# Patient Record
Sex: Female | Born: 1980 | Race: White | Hispanic: No | Smoking: Current every day smoker
Health system: Southern US, Community
[De-identification: ages and names within clinical notes are randomized; demographics above are authoritative.]

## PROBLEM LIST (undated history)

## (undated) DIAGNOSIS — F1990 Other psychoactive substance use, unspecified, uncomplicated: Secondary | ICD-10-CM

## (undated) HISTORY — PX: APPENDECTOMY: SHX54

---

## 2019-03-24 ENCOUNTER — Emergency Department (HOSPITAL_COMMUNITY)
Admission: EM | Admit: 2019-03-24 | Discharge: 2019-03-24 | Disposition: A | Discharge status: Home / Self Care | Payer: Medicaid Other | Attending: Emergency Medicine | Admitting: Emergency Medicine

## 2019-03-24 ENCOUNTER — Other Ambulatory Visit: Payer: Self-pay

## 2019-03-24 ENCOUNTER — Encounter (HOSPITAL_COMMUNITY): Payer: Self-pay | Admitting: Emergency Medicine

## 2019-03-24 ENCOUNTER — Emergency Department (HOSPITAL_COMMUNITY): Payer: Medicaid Other

## 2019-03-24 DIAGNOSIS — M545 Low back pain: Secondary | ICD-10-CM | POA: Diagnosis present

## 2019-03-24 DIAGNOSIS — M5136 Other intervertebral disc degeneration, lumbar region: Secondary | ICD-10-CM | POA: Diagnosis not present

## 2019-03-24 DIAGNOSIS — M4698 Unspecified inflammatory spondylopathy, sacral and sacrococcygeal region: Secondary | ICD-10-CM | POA: Insufficient documentation

## 2019-03-24 DIAGNOSIS — M47817 Spondylosis without myelopathy or radiculopathy, lumbosacral region: Secondary | ICD-10-CM

## 2019-03-24 DIAGNOSIS — M51369 Other intervertebral disc degeneration, lumbar region without mention of lumbar back pain or lower extremity pain: Secondary | ICD-10-CM

## 2019-03-24 DIAGNOSIS — M4696 Unspecified inflammatory spondylopathy, lumbar region: Secondary | ICD-10-CM | POA: Insufficient documentation

## 2019-03-24 LAB — I-STAT BETA HCG BLOOD, ED (MC, WL, AP ONLY): I-stat hCG, quantitative: <5 m[IU]/mL (ref <5)

## 2019-03-24 MED ORDER — PROCHLORPERAZINE EDISYLATE 10 MG/2ML IJ SOLN
5.0000 mg | Freq: Once | INTRAMUSCULAR | Status: AC
  Administered 2019-03-24: 12:00:00 5 mg via INTRAVENOUS
  Filled 2019-03-24: qty 2

## 2019-03-24 MED ORDER — CYCLOBENZAPRINE HCL 10 MG PO TABS: 10.0000 mg | ORAL_TABLET | Freq: Three times a day (TID) | ORAL | 0 refills | Status: AC

## 2019-03-24 MED ORDER — HYDROCODONE-ACETAMINOPHEN 5-325 MG PO TABS: 1.0000 | ORAL_TABLET | ORAL | 0 refills | Status: AC | PRN

## 2019-03-24 MED ORDER — FENTANYL CITRATE (PF) 100 MCG/2ML IJ SOLN
50.0000 ug | Freq: Once | INTRAMUSCULAR | Status: AC
  Administered 2019-03-24: 12:00:00 50 ug via INTRAVENOUS
  Filled 2019-03-24: qty 2

## 2019-03-24 MED ORDER — HYDROMORPHONE HCL 1 MG/ML IJ SOLN
0.5000 mg | Freq: Once | INTRAMUSCULAR | Status: AC
  Administered 2019-03-24: 0.5 mg via INTRAVENOUS
  Filled 2019-03-24: qty 1

## 2019-03-24 MED ORDER — METHOCARBAMOL 1000 MG/10ML IJ SOLN
1000.0000 mg | Freq: Once | INTRAVENOUS | Status: AC
  Administered 2019-03-24: 1000 mg via INTRAVENOUS
  Filled 2019-03-24: qty 10

## 2019-03-24 MED ORDER — PROCHLORPERAZINE EDISYLATE 10 MG/2ML IJ SOLN
5.0000 mg | Freq: Once | INTRAMUSCULAR | Status: AC
  Administered 2019-03-24: 5 mg via INTRAVENOUS
  Filled 2019-03-24: qty 2

## 2019-03-24 MED ORDER — DICLOFENAC SODIUM 75 MG PO TBEC: 75.0000 mg | DELAYED_RELEASE_TABLET | Freq: Two times a day (BID) | ORAL | 0 refills | Status: AC

## 2019-03-24 MED ORDER — KETOROLAC TROMETHAMINE 30 MG/ML IJ SOLN
30.0000 mg | Freq: Once | INTRAMUSCULAR | Status: AC
  Administered 2019-03-24: 30 mg via INTRAVENOUS
  Filled 2019-03-24: qty 1

## 2019-03-24 NOTE — ED Provider Notes (Signed)
[facility] EMERGENCY DEPARTMENT Provider Note   CSN: [id number] Arrival date & time: 03/24/19  1057     History   Chief Complaint Chief Complaint  Patient presents with  . Back Pain    HPI Yvonne Hill is a 38 y.o. female.     Patient is a 38 year old female who presents to the emergency department with lower back pain.  Patient states she has been having some problems off and on for nearly a month.  Recently she had pain on the right side of her back and extending into the buttocks.  She says that this did not last long and responded to over-the-counter medications and heat.  In the last few days however she has had pain of the left back, hip, and extending into the leg.  She has a numb sensation.  She has pain that feels like cramps and spasm in that area.  She has difficulty with walking.  She has some numbness on the inside of her thigh as well.  There is been no loss of bowel or bladder function reported.  No recent injury or trauma.  No recent operations or procedures.     History reviewed. No pertinent past medical history.  There are no active problems to display for this patient.   Past Surgical History:  Procedure Laterality Date  . APPENDECTOMY       OB History    Gravida  0   Para  0   Term  0   Preterm  0   AB  0   Living  0     SAB  0   TAB  0   Ectopic  0   Multiple  0   Live Births  0            Home Medications    Prior to Admission medications   Not on File    Family History Family History  Problem Relation Age of Onset  . Diabetes Other     Social History Social History   Tobacco Use  . Smoking status: Current Every Day Smoker    Packs/day: 0.50    Years: 10.00    Pack years: 5.00    Types: Cigarettes  . Smokeless tobacco: Never Used  Substance Use Topics  . Alcohol use: Never    Frequency: Never  . Drug use: Never     Allergies   Patient has no known allergies.   Review of Systems Review of  Systems  Constitutional: Negative for activity change and appetite change.  HENT: Negative for congestion, ear discharge, ear pain, facial swelling, nosebleeds, rhinorrhea, sneezing and tinnitus.   Eyes: Negative for photophobia, pain and discharge.  Respiratory: Negative for cough, choking, shortness of breath and wheezing.   Cardiovascular: Negative for chest pain, palpitations and leg swelling.  Gastrointestinal: Negative for abdominal pain, blood in stool, constipation, diarrhea, nausea and vomiting.  Genitourinary: Negative for difficulty urinating, dysuria, flank pain, frequency and hematuria.  Musculoskeletal: Positive for back pain. Negative for gait problem, myalgias and neck pain.  Skin: Negative for color change, rash and wound.  Neurological: Positive for numbness. Negative for dizziness, seizures, syncope, facial asymmetry, speech difficulty and weakness.  Hematological: Negative for adenopathy. Does not bruise/bleed easily.  Psychiatric/Behavioral: Negative for agitation, confusion, hallucinations, self-injury and suicidal ideas. The patient is not nervous/anxious.      Physical Exam Updated Vital Signs BP 110/62 (BP Location: Right Arm)   Pulse 99   Temp 97.7 F (36.5  C)   Resp 20   Ht 5\' 7"  (1.702 m)   Wt 81.6 kg   LMP 03/21/2019   SpO2 98%   BMI 28.19 kg/m   Physical Exam Vitals signs and nursing note reviewed.  Constitutional:      Appearance: She is well-developed. She is not toxic-appearing.  HENT:     Head: Normocephalic.     Right Ear: Tympanic membrane and external ear normal.     Left Ear: Tympanic membrane and external ear normal.  Eyes:     General: Lids are normal.     Pupils: Pupils are equal, round, and reactive to light.  Neck:     Musculoskeletal: Normal range of motion and neck supple.     Vascular: No carotid bruit.  Cardiovascular:     Rate and Rhythm: Normal rate and regular rhythm.     Pulses: Normal pulses.     Heart sounds: Normal  heart sounds.  Pulmonary:     Effort: No respiratory distress.     Breath sounds: Normal breath sounds.  Abdominal:     General: Bowel sounds are normal.     Palpations: Abdomen is soft.     Tenderness: There is no abdominal tenderness. There is no guarding.  Musculoskeletal:     Lumbar back: She exhibits decreased range of motion, pain and spasm.       Back:     Comments: Severe pain with attempted range of motion and also attempted straight leg raise on the left.  No palpable step-off of the cervical, thoracic, or lumbar spine.  No hot areas appreciated.  Lymphadenopathy:     Head:     Right side of head: No submandibular adenopathy.     Left side of head: No submandibular adenopathy.     Cervical: No cervical adenopathy.  Skin:    General: Skin is warm and dry.  Neurological:     Mental Status: She is alert and oriented to person, place, and time.     Cranial Nerves: No cranial nerve deficit.     Sensory: No sensory deficit.     Comments: Patient has a change in sensation of the posterior thigh extending down to the calf.  Patient can feel light touch, but says the basic sensation is numb.  Patient reluctant to motor examination due to pain.  Psychiatric:        Speech: Speech normal.      ED Treatments / Results  Labs (all labs ordered are listed, but only abnormal results are displayed) Labs Reviewed - No data to display  EKG None  Radiology No results found.  Procedures Procedures (including critical care time)  Medications Ordered in ED Medications - No data to display   Initial Impression / Assessment and Plan / ED Course  I have reviewed the triage vital signs and the nursing notes.  Pertinent labs & imaging results that were available during my care of the patient were reviewed by me and considered in my medical decision making (see chart for details).          Final Clinical Impressions(s) / ED Diagnoses MDM  Patient presents to the  emergency department with a complaint of severe back pain.  The patient states that she is been told that she had some bulging disc in the past, but recently her pain has been severe.  She says she is even having some numbness in the areas of her inner thigh.  She has pain and numbness  of the back of the left thigh and she has pain with even attempted straight leg raise.  Will obtain a CT scan and a urine for evaluation.  There are no hot areas appreciated.  The patient denies IV drug use.  There is no palpable step-off or deformity.  CT scan of the lumbar spine shows advanced L5-S1 disc degeneration with bilateral lateral recess and neural foraminal stenosis.  There is a vacuum disc and spurring present.  Patient states she only got minimal relief from the initial medication for pain.  An additional dose of medication for pain was given.  The patient states that she cannot find a comfortable position on the bed and she would like to go home.  No new changes noted in the neurologic examination. Prescription for Flexeril, diclofenac, and hydrocodone given to the patient.  The patient is referred to Dr. Charlann Boxer with orthopedics for additional evaluation concerning her back.  Patient is in agreement with this plan.   Final diagnoses:  DDD (degenerative disc disease), lumbar  Lumbar and sacral arthritis    ED Discharge Orders         Ordered    cyclobenzaprine (FLEXERIL) 10 MG tablet  3 times daily     03/24/19 1447    HYDROcodone-acetaminophen (NORCO/VICODIN) 5-325 MG tablet  Every 4 hours PRN     03/24/19 1447    diclofenac (VOLTAREN) 75 MG EC tablet  2 times daily     03/24/19 1447           Ivery Quale, PA-C 03/24/19 2014    Bethann Berkshire, MD 03/28/19 1119

## 2019-03-24 NOTE — ED Triage Notes (Signed)
Patient c/o lower back pain that radiates into left hip and leg. Denies any known injury. Per patient pain x1 month, originally radiated down right leg but that improved and is now radiating into left. Denies any complications with BMs or urination. CNS intact. Per patient using aleve with no relief, last dose last night.

## 2019-03-24 NOTE — ED Notes (Signed)
Pt ambulatory to BR. Pt reminded of UA sample, pt did not collect.

## 2019-03-24 NOTE — ED Notes (Signed)
Pt informed of need for urine. Pt states she is unable to urinate at this time.

## 2019-03-24 NOTE — Discharge Instructions (Signed)
Your CT scan reveals some degenerative disc disease, a spur on your lumbar and sacral spine area, as well as arthritis in the lumbar and sacral area.  Please continue to use your heating pad.  Please see Dr. Alvan Dame for orthopedic evaluation and management of this issue as soon as possible.  Use diclofenac 2 times daily.  Use Flexeril 3 times daily for spasm pain.  May use Norco for more severe pain.  Flexeril and Norco may cause drowsiness, and/or lightheadedness.  Please do not drive a vehicle, operate machinery, drink alcohol, or participate in activities requiring concentration when taking either these medications.

## 2019-03-24 NOTE — ED Notes (Signed)
Pt requesting discharge instructions.

## 2019-03-24 NOTE — ED Notes (Signed)
EDP at bedside  

## 2019-03-24 NOTE — ED Notes (Signed)
Pt ambulatory to BR. Pt did not get urine sample.

## 2019-03-27 ENCOUNTER — Inpatient Hospital Stay (HOSPITAL_COMMUNITY)
Admission: EM | Admit: 2019-03-27 | Discharge: 2019-04-04 | DRG: 871 | Disposition: E | Payer: Medicaid Other | Attending: Internal Medicine | Admitting: Internal Medicine

## 2019-03-27 ENCOUNTER — Emergency Department (HOSPITAL_COMMUNITY): Payer: Medicaid Other

## 2019-03-27 ENCOUNTER — Encounter (HOSPITAL_COMMUNITY): Payer: Self-pay | Admitting: Emergency Medicine

## 2019-03-27 ENCOUNTER — Inpatient Hospital Stay (HOSPITAL_COMMUNITY): Payer: Medicaid Other

## 2019-03-27 ENCOUNTER — Other Ambulatory Visit: Payer: Self-pay

## 2019-03-27 DIAGNOSIS — M4647 Discitis, unspecified, lumbosacral region: Secondary | ICD-10-CM | POA: Diagnosis present

## 2019-03-27 DIAGNOSIS — N179 Acute kidney failure, unspecified: Secondary | ICD-10-CM

## 2019-03-27 DIAGNOSIS — Z79899 Other long term (current) drug therapy: Secondary | ICD-10-CM

## 2019-03-27 DIAGNOSIS — I2699 Other pulmonary embolism without acute cor pulmonale: Secondary | ICD-10-CM | POA: Diagnosis present

## 2019-03-27 DIAGNOSIS — J9690 Respiratory failure, unspecified, unspecified whether with hypoxia or hypercapnia: Secondary | ICD-10-CM | POA: Diagnosis present

## 2019-03-27 DIAGNOSIS — M6282 Rhabdomyolysis: Secondary | ICD-10-CM | POA: Diagnosis present

## 2019-03-27 DIAGNOSIS — D709 Neutropenia, unspecified: Secondary | ICD-10-CM | POA: Diagnosis present

## 2019-03-27 DIAGNOSIS — F1510 Other stimulant abuse, uncomplicated: Secondary | ICD-10-CM | POA: Diagnosis present

## 2019-03-27 DIAGNOSIS — R471 Dysarthria and anarthria: Secondary | ICD-10-CM | POA: Diagnosis present

## 2019-03-27 DIAGNOSIS — Z20828 Contact with and (suspected) exposure to other viral communicable diseases: Secondary | ICD-10-CM | POA: Diagnosis present

## 2019-03-27 DIAGNOSIS — F17210 Nicotine dependence, cigarettes, uncomplicated: Secondary | ICD-10-CM | POA: Diagnosis present

## 2019-03-27 DIAGNOSIS — A419 Sepsis, unspecified organism: Principal | ICD-10-CM | POA: Diagnosis present

## 2019-03-27 DIAGNOSIS — I76 Septic arterial embolism: Secondary | ICD-10-CM | POA: Diagnosis present

## 2019-03-27 DIAGNOSIS — I959 Hypotension, unspecified: Secondary | ICD-10-CM

## 2019-03-27 DIAGNOSIS — R6521 Severe sepsis with septic shock: Secondary | ICD-10-CM | POA: Diagnosis present

## 2019-03-27 HISTORY — DX: Other psychoactive substance use, unspecified, uncomplicated: F19.90

## 2019-03-27 LAB — RAPID URINE DRUG SCREEN, HOSP PERFORMED
Amphetamines: POSITIVE — AB
Barbiturates: NOT DETECTED
Benzodiazepines: NOT DETECTED
Cocaine: NOT DETECTED
Opiates: POSITIVE — AB
Tetrahydrocannabinol: NOT DETECTED

## 2019-03-27 LAB — COMPREHENSIVE METABOLIC PANEL
ALT: 65 U/L — ABNORMAL HIGH (ref 0–44)
AST: 166 U/L — ABNORMAL HIGH (ref 15–41)
Albumin: 2 g/dL — ABNORMAL LOW (ref 3.5–5.0)
Alkaline Phosphatase: 152 U/L — ABNORMAL HIGH (ref 38–126)
Anion gap: 20 — ABNORMAL HIGH (ref 5–15)
BUN: 47 mg/dL — ABNORMAL HIGH (ref 6–20)
CO2: 15 mmol/L — ABNORMAL LOW (ref 22–32)
Calcium: 7 mg/dL — ABNORMAL LOW (ref 8.9–10.3)
Chloride: 97 mmol/L — ABNORMAL LOW (ref 98–111)
Creatinine, Ser: 4.82 mg/dL — ABNORMAL HIGH (ref 0.44–1.00)
GFR calc Af Amer: 12 mL/min — ABNORMAL LOW (ref >60)
GFR calc non Af Amer: 11 mL/min — ABNORMAL LOW (ref >60)
Glucose, Bld: 86 mg/dL (ref 70–99)
Potassium: 3.4 mmol/L — ABNORMAL LOW (ref 3.5–5.1)
Sodium: 132 mmol/L — ABNORMAL LOW (ref 135–145)
Total Bilirubin: 6.1 mg/dL — ABNORMAL HIGH (ref 0.3–1.2)
Total Protein: 5.4 g/dL — ABNORMAL LOW (ref 6.5–8.1)

## 2019-03-27 LAB — URINALYSIS, ROUTINE W REFLEX MICROSCOPIC
Glucose, UA: NEGATIVE mg/dL
Ketones, ur: NEGATIVE mg/dL
Nitrite: NEGATIVE
Protein, ur: 100 mg/dL — AB
Specific Gravity, Urine: 1.018 (ref 1.005–1.030)
WBC, UA: >50 WBC/hpf — ABNORMAL HIGH (ref 0–5)
pH: 5 (ref 5.0–8.0)

## 2019-03-27 LAB — BLOOD GAS, ARTERIAL
Acid-base deficit: 14.7 mmol/L — ABNORMAL HIGH (ref 0.0–2.0)
Bicarbonate: 11.1 mmol/L — ABNORMAL LOW (ref 20.0–28.0)
FIO2: 28
O2 Saturation: 17.3 %
Patient temperature: 37.4
pCO2 arterial: 45.3 mmHg (ref 32.0–48.0)
pH, Arterial: 7.093 — CL (ref 7.350–7.450)
pO2, Arterial: <31 mmHg — CL (ref 83.0–108.0)

## 2019-03-27 LAB — CBC WITH DIFFERENTIAL/PLATELET
Abs Immature Granulocytes: 0.06 10*3/uL (ref 0.00–0.07)
Basophils Absolute: 0.1 10*3/uL (ref 0.0–0.1)
Basophils Relative: 2 %
Eosinophils Absolute: 0 10*3/uL (ref 0.0–0.5)
Eosinophils Relative: 0 %
HCT: 42.2 % (ref 36.0–46.0)
Hemoglobin: 13.4 g/dL (ref 12.0–15.0)
Immature Granulocytes: 2 %
Lymphocytes Relative: 10 %
Lymphs Abs: 0.3 10*3/uL — ABNORMAL LOW (ref 0.7–4.0)
MCH: 27.7 pg (ref 26.0–34.0)
MCHC: 31.8 g/dL (ref 30.0–36.0)
MCV: 87.4 fL (ref 80.0–100.0)
Monocytes Absolute: 0.2 10*3/uL (ref 0.1–1.0)
Monocytes Relative: 7 %
Neutro Abs: 2.2 10*3/uL (ref 1.7–7.7)
Neutrophils Relative %: 79 %
Platelets: 162 10*3/uL (ref 150–400)
RBC: 4.83 MIL/uL (ref 3.87–5.11)
RDW: 12.9 % (ref 11.5–15.5)
WBC: 2.8 10*3/uL — ABNORMAL LOW (ref 4.0–10.5)
nRBC: 0 % (ref 0.0–0.2)

## 2019-03-27 LAB — APTT: aPTT: 34 seconds (ref 24–36)

## 2019-03-27 LAB — POCT I-STAT, CHEM 8
BUN: 40 mg/dL — ABNORMAL HIGH (ref 6–20)
Calcium, Ion: 0.91 mmol/L — ABNORMAL LOW (ref 1.15–1.40)
Chloride: 99 mmol/L (ref 98–111)
Creatinine, Ser: 5.4 mg/dL — ABNORMAL HIGH (ref 0.44–1.00)
Glucose, Bld: 80 mg/dL (ref 70–99)
HCT: 41 % (ref 36.0–46.0)
Hemoglobin: 13.9 g/dL (ref 12.0–15.0)
Potassium: 3.5 mmol/L (ref 3.5–5.1)
Sodium: 132 mmol/L — ABNORMAL LOW (ref 135–145)
TCO2: 16 mmol/L — ABNORMAL LOW (ref 22–32)

## 2019-03-27 LAB — SAMPLE TO BLOOD BANK

## 2019-03-27 LAB — LACTIC ACID, PLASMA
Lactic Acid, Venous: 5.3 mmol/L (ref 0.5–1.9)
Lactic Acid, Venous: 4.4 mmol/L (ref 0.5–1.9)

## 2019-03-27 LAB — TROPONIN I (HIGH SENSITIVITY)
Troponin I (High Sensitivity): 285 ng/L (ref <18)
Troponin I (High Sensitivity): 581 ng/L (ref <18)

## 2019-03-27 LAB — CK: Total CK: 4697 U/L — ABNORMAL HIGH (ref 38–234)

## 2019-03-27 LAB — SARS CORONAVIRUS 2 BY RT PCR (HOSPITAL ORDER, PERFORMED IN ~~LOC~~ HOSPITAL LAB): SARS Coronavirus 2: NEGATIVE

## 2019-03-27 LAB — PROTIME-INR
INR: 2.7 — ABNORMAL HIGH (ref 0.8–1.2)
Prothrombin Time: 28.2 seconds — ABNORMAL HIGH (ref 11.4–15.2)

## 2019-03-27 LAB — GLUCOSE, CAPILLARY: Glucose-Capillary: 80 mg/dL (ref 70–99)

## 2019-03-27 MED ORDER — HEPARIN (PORCINE) 25000 UT/250ML-% IV SOLN
900.0000 [IU]/h | INTRAVENOUS | Status: DC
  Administered 2019-03-27: 900 [IU]/h via INTRAVENOUS
  Filled 2019-03-27: qty 250

## 2019-03-27 MED ORDER — LACTATED RINGERS IV BOLUS
1000.0000 mL | Freq: Once | INTRAVENOUS | Status: AC
  Administered 2019-03-27: 1000 mL via INTRAVENOUS

## 2019-03-27 MED ORDER — MORPHINE SULFATE (PF) 4 MG/ML IV SOLN
4.0000 mg | Freq: Once | INTRAVENOUS | Status: AC
  Administered 2019-03-27: 4 mg via INTRAVENOUS
  Filled 2019-03-27: qty 1

## 2019-03-27 MED ORDER — SODIUM CHLORIDE 0.9 % IV SOLN
2.0000 g | Freq: Once | INTRAVENOUS | Status: AC
  Administered 2019-03-27: 2 g via INTRAVENOUS
  Filled 2019-03-27: qty 20

## 2019-03-27 MED ORDER — HYDROCORTISONE NA SUCCINATE PF 100 MG IJ SOLR
50.0000 mg | Freq: Once | INTRAMUSCULAR | Status: AC
  Administered 2019-03-27: 50 mg via INTRAVENOUS
  Filled 2019-03-27: qty 2

## 2019-03-27 MED ORDER — SODIUM CHLORIDE 0.9 % IV BOLUS
1000.0000 mL | Freq: Once | INTRAVENOUS | Status: AC
  Administered 2019-03-27: 1000 mL via INTRAVENOUS

## 2019-03-27 MED ORDER — IOHEXOL 350 MG/ML SOLN
100.0000 mL | Freq: Once | INTRAVENOUS | Status: AC | PRN
  Administered 2019-03-27: 100 mL via INTRAVENOUS

## 2019-03-27 MED ORDER — PHENYLEPHRINE HCL-NACL 10-0.9 MG/250ML-% IV SOLN
0.0000 ug/min | INTRAVENOUS | Status: DC
  Administered 2019-03-27: 20 ug/min via INTRAVENOUS
  Administered 2019-03-27: 100 ug/min via INTRAVENOUS
  Filled 2019-03-27 (×4): qty 250

## 2019-03-27 MED ORDER — NOREPINEPHRINE 4 MG/250ML-% IV SOLN
0.0000 ug/min | INTRAVENOUS | Status: DC
  Administered 2019-03-27: 2 ug/min via INTRAVENOUS
  Filled 2019-03-27: qty 250

## 2019-03-27 MED ORDER — ONDANSETRON HCL 4 MG/2ML IJ SOLN
4.0000 mg | Freq: Once | INTRAMUSCULAR | Status: AC
  Administered 2019-03-27: 4 mg via INTRAVENOUS
  Filled 2019-03-27: qty 2

## 2019-03-27 MED ORDER — SODIUM CHLORIDE 0.9 % IV SOLN
INTRAVENOUS | Status: DC
  Administered 2019-03-27: 09:00:00 via INTRAVENOUS

## 2019-03-27 MED ORDER — SODIUM CHLORIDE 0.9 % IV SOLN: INTRAVENOUS | Status: DC | PRN

## 2019-03-27 MED ORDER — FENTANYL CITRATE (PF) 100 MCG/2ML IJ SOLN
50.0000 ug | Freq: Once | INTRAMUSCULAR | Status: AC
  Administered 2019-03-27: 50 ug via INTRAVENOUS
  Filled 2019-03-27: qty 2

## 2019-03-27 MED ORDER — LORAZEPAM 2 MG/ML IJ SOLN
1.0000 mg | Freq: Once | INTRAMUSCULAR | Status: AC
  Administered 2019-03-27: 1 mg via INTRAVENOUS
  Filled 2019-03-27: qty 1

## 2019-03-27 MED ORDER — HEPARIN BOLUS VIA INFUSION
4000.0000 [IU] | Freq: Once | INTRAVENOUS | Status: AC
  Administered 2019-03-27: 4000 [IU] via INTRAVENOUS

## 2019-03-27 MED ORDER — VANCOMYCIN HCL IN DEXTROSE 1-5 GM/200ML-% IV SOLN
1000.0000 mg | Freq: Once | INTRAVENOUS | Status: AC
  Administered 2019-03-27: 1000 mg via INTRAVENOUS
  Filled 2019-03-27: qty 200

## 2019-03-28 MED FILL — Medication: Qty: 1 | Status: AC

## 2019-03-29 LAB — CULTURE, BLOOD (SINGLE)

## 2019-03-29 LAB — CULTURE, BLOOD (ROUTINE X 2)

## 2019-04-04 LAB — SUSCEPTIBILITY RESULT

## 2019-04-04 LAB — SUSCEPTIBILITY, AER + ANAEROB

## 2019-04-04 NOTE — ED Notes (Signed)
CRITICAL VALUE ALERT  Critical Value:  PH 7.093, pO2 <31  Date & Time Notied:  15-Apr-2019, 1053  Provider Notified: Dr. Wilson Singer  Orders Received/Actions taken: no new orders, respiratory reported dark red blood returned into ABG draw and MD aware

## 2019-04-04 NOTE — ED Notes (Addendum)
Time of Death called at 1134

## 2019-04-04 NOTE — ED Notes (Signed)
CRITICAL VALUE ALERT  Critical Value:  Trop 581, Lactic 4.4  Date & Time Notied:  04/22/2019, 0906  Provider Notified: Dr. Wilson Singer  Orders Received/Actions taken: none

## 2019-04-04 NOTE — ED Notes (Signed)
Date and time results received: 2019/04/13 0549 (use smartphrase ".now" to insert current time)  Test: troponin Critical Value: 285  Name of Provider Notified: Dr Tomi Bamberger  Orders Received? Or Actions Taken?: Actions Taken: no orders received

## 2019-04-04 NOTE — ED Notes (Signed)
PT noted to be diaphoretic and crying out in pain and moving all over the bed. Attempting to reposition the patient at requests but doesn't help pain. EDP ordered Ativan and Fent at this time.

## 2019-04-04 NOTE — ED Provider Notes (Addendum)
[facility]ENCY [facility]PARTMENT Provider Note   CSN: [id number] Arrival date & time: 2019/05/06  0440   Time seen 4:38 AM on arrival  History   Chief Complaint Chief Complaint  Patient presents with   Shortness of Breath    HPI Yvonne Hill is a 38 y.o. female.     HPI patient presents via EMS.  She was seen in the ED on September 20 for complaints of severe back pain and had a CT of her lumbar spine done.  She reports 10 to 12 hours ago her skin changed and she has mottling.  She states she is having back pain and pain in her legs.  She denies chest pain but states she has shortness of breath.  EMS said they had a trouble getting a pulse and they gave her 150 cc fluid bolus and her blood pressure was 103.  Patient denies being on any medications.  She states she does smoke a pack per day.  PCP Patient, No Pcp Per   Past Medical History:  Diagnosis Date   IV drug user     Patient Active Problem List   Diagnosis Date Noted   Septic embolism (HCC) 02020/05/219    Past Surgical History:  Procedure Laterality Date   APPENDECTOMY       OB History    Gravida  0   Para  0   Term  0   Preterm  0   AB  0   Living  0     SAB  0   TAB  0   Ectopic  0   Multiple  0   Live Births  0            Home Medications    Prior to Admission medications   Medication Sig Start Date End Date Taking? Authorizing Provider  cyclobenzaprine (FLEXERIL) 10 MG tablet Take 1 tablet (10 mg total) by mouth 3 (three) times daily. 03/24/19   Ivery QualeBryant, Hobson, PA-C  diclofenac (VOLTAREN) 75 MG EC tablet Take 1 tablet (75 mg total) by mouth 2 (two) times daily. 03/24/19   Ivery QualeBryant, Hobson, PA-C  HYDROcodone-acetaminophen (NORCO/VICODIN) 5-325 MG tablet Take 1 tablet by mouth every 4 (four) hours as needed. 03/24/19   Ivery QualeBryant, Hobson, PA-C    Family History Family History  Problem Relation Age of Onset   Diabetes Other     Social History Social History   Tobacco Use    Smoking status: Current Every Day Smoker    Packs/day: 0.50    Years: 10.00    Pack years: 5.00    Types: Cigarettes   Smokeless tobacco: Never Used  Substance Use Topics   Alcohol use: Never    Frequency: Never   Drug use: Never     Allergies   Patient has no known allergies.   Review of Systems Review of Systems  All other systems reviewed and are negative.    Physical Exam Updated Vital Signs BP (!) 81/34    Pulse (!) 120    Temp 97.9 F (36.6 C)    Resp (!) 27    LMP 03/21/2019    SpO2 (!) 75%   Physical Exam Vitals signs and nursing note reviewed.  Constitutional:      General: She is in acute distress.     Appearance: She is ill-appearing and toxic-appearing.  HENT:     Head: Normocephalic and atraumatic.     Right Ear: External ear normal.     Left  Ear: External ear normal.     Nose: Nose normal.     Mouth/Throat:     Mouth: Mucous membranes are dry.  Eyes:     Extraocular Movements: Extraocular movements intact.     Conjunctiva/sclera: Conjunctivae normal.     Pupils: Pupils are equal, round, and reactive to light.  Cardiovascular:     Rate and Rhythm: Regular rhythm. Tachycardia present.  Pulmonary:     Effort: Pulmonary effort is normal. No respiratory distress.  Musculoskeletal:        General: Tenderness present.  Skin:    Comments: Patient's hands and feet are cold to touch.  Her abdomen has clammy skin.  Neurological:     General: No focal deficit present.     Mental Status: She is alert and oriented to person, place, and time.     Cranial Nerves: No cranial nerve deficit.  Psychiatric:        Mood and Affect: Mood is anxious.        Speech: Speech is rapid and pressured.        Behavior: Behavior is cooperative.          ED Treatments / Results  Labs (all labs ordered are listed, but only abnormal results are displayed) Results for orders placed or performed during the hospital encounter of 2019-04-10  SARS Coronavirus 2  [facility] order, Performed in [facility] hospital lab) Nasopharyngeal Nasopharyngeal Swab   Specimen: Nasopharyngeal Swab  Result Value Ref Range   SARS Coronavirus 2 NEGATIVE NEGATIVE  Comprehensive metabolic panel  Result Value Ref Range   Sodium 132 (L) 135 - 145 mmol/L   Potassium 3.4 (L) 3.5 - 5.1 mmol/L   Chloride 97 (L) 98 - 111 mmol/L   CO2 15 (L) 22 - 32 mmol/L   Glucose, Bld 86 70 - 99 mg/dL   BUN 47 (H) 6 - 20 mg/dL   Creatinine, Ser 5.53 (H) 0.44 - 1.00 mg/dL   Calcium 7.0 (L) 8.9 - 10.3 mg/dL   Total Protein 5.4 (L) 6.5 - 8.1 g/dL   Albumin 2.0 (L) 3.5 - 5.0 g/dL   AST 748 (H) 15 - 41 U/L   ALT 65 (H) 0 - 44 U/L   Alkaline Phosphatase 152 (H) 38 - 126 U/L   Total Bilirubin 6.1 (H) 0.3 - 1.2 mg/dL   GFR calc non Af Amer 11 (L) >60 mL/min   GFR calc Af Amer 12 (L) >60 mL/min   Anion gap 20 (H) 5 - 15  CBC with Differential  Result Value Ref Range   WBC 2.8 (L) 4.0 - 10.5 K/uL   RBC 4.83 3.87 - 5.11 MIL/uL   Hemoglobin 13.4 12.0 - 15.0 g/dL   HCT 27.0 78.6 - 75.4 %   MCV 87.4 80.0 - 100.0 fL   MCH 27.7 26.0 - 34.0 pg   MCHC 31.8 30.0 - 36.0 g/dL   RDW 49.2 01.0 - 07.1 %   Platelets 162 150 - 400 K/uL   nRBC 0.0 0.0 - 0.2 %   Neutrophils Relative % 79 %   Neutro Abs 2.2 1.7 - 7.7 K/uL   Lymphocytes Relative 10 %   Lymphs Abs 0.3 (L) 0.7 - 4.0 K/uL   Monocytes Relative 7 %   Monocytes Absolute 0.2 0.1 - 1.0 K/uL   Eosinophils Relative 0 %   Eosinophils Absolute 0.0 0.0 - 0.5 K/uL   Basophils Relative 2 %   Basophils Absolute 0.1 0.0 - 0.1 K/uL  WBC Morphology DOHLE BODIES    Immature Granulocytes 2 %   Abs Immature Granulocytes 0.06 0.00 - 0.07 K/uL  Protime-INR  Result Value Ref Range   Prothrombin Time 28.2 (H) 11.4 - 15.2 seconds   INR 2.7 (H) 0.8 - 1.2  APTT  Result Value Ref Range   aPTT 34 24 - 36 seconds  CK  Result Value Ref Range   Total CK 4,697 (H) 38 - 234 U/L  Lactic acid, plasma  Result Value Ref Range   Lactic Acid, Venous 5.3 (HH)  0.5 - 1.9 mmol/L  Glucose, capillary  Result Value Ref Range   Glucose-Capillary 80 70 - 99 mg/dL  Urinalysis, Routine w reflex microscopic  Result Value Ref Range   Color, Urine AMBER (A) YELLOW   APPearance CLOUDY (A) CLEAR   Specific Gravity, Urine 1.018 1.005 - 1.030   pH 5.0 5.0 - 8.0   Glucose, UA NEGATIVE NEGATIVE mg/dL   Hgb urine dipstick MODERATE (A) NEGATIVE   Bilirubin Urine SMALL (A) NEGATIVE   Ketones, ur NEGATIVE NEGATIVE mg/dL   Protein, ur 893 (A) NEGATIVE mg/dL   Nitrite NEGATIVE NEGATIVE   Leukocytes,Ua MODERATE (A) NEGATIVE   RBC / HPF 11-20 0 - 5 RBC/hpf   WBC, UA >50 (H) 0 - 5 WBC/hpf   Bacteria, UA FEW (A) NONE SEEN   Squamous Epithelial / LPF 6-10 0 - 5   WBC Clumps PRESENT    Mucus PRESENT    Budding Yeast PRESENT    Hyaline Casts, UA PRESENT    Uric Acid Crys, UA PRESENT    Non Squamous Epithelial 0-5 (A) NONE SEEN  Urine rapid drug screen (hosp performed)  Result Value Ref Range   Opiates POSITIVE (A) NONE DETECTED   Cocaine NONE DETECTED NONE DETECTED   Benzodiazepines NONE DETECTED NONE DETECTED   Amphetamines POSITIVE (A) NONE DETECTED   Tetrahydrocannabinol NONE DETECTED NONE DETECTED   Barbiturates NONE DETECTED NONE DETECTED  I-STAT, chem 8  Result Value Ref Range   Sodium 132 (L) 135 - 145 mmol/L   Potassium 3.5 3.5 - 5.1 mmol/L   Chloride 99 98 - 111 mmol/L   BUN 40 (H) 6 - 20 mg/dL   Creatinine, Ser 7.34 (H) 0.44 - 1.00 mg/dL   Glucose, Bld 80 70 - 99 mg/dL   Calcium, Ion 2.87 (L) 1.15 - 1.40 mmol/L   TCO2 16 (L) 22 - 32 mmol/L   Hemoglobin 13.9 12.0 - 15.0 g/dL   HCT 68.1 15.7 - 26.2 %  Troponin I (High Sensitivity)  Result Value Ref Range   Troponin I (High Sensitivity) 285 (HH) <18 ng/L   Laboratory interpretation all normal except renal failure, elevated liver function test, elevation of CK consistent with rhabdomyolysis.  Positive lactic acid, positive initial troponin, low initial white blood cell count, positive UDS for  amphetamines, I did give patient 1 dose of a narcotic pain medicine before her urine was obtained.    EKG EKG Interpretation  Date/Time:  Wednesday 04-25-19 04:43:15 EDT Ventricular Rate:  129 PR Interval:    QRS Duration: 81 QT Interval:  287 QTC Calculation: 421 R Axis:   66 Text Interpretation:  Sinus tachycardia Otherwise within normal limits No old tracing to compare Confirmed by Devoria Albe ([id number]) on 04-25-19 5:01:49 AM   Radiology Ct Angio Chest/abd/pel For Dissection W And/or W/wo  Result Date: April 25, 2019 CLINICAL DATA:  Back pain.  Mottling of extremities EXAM: CT ANGIOGRAPHY CHEST, ABDOMEN AND PELVIS TECHNIQUE:  Multidetector CT imaging through the chest, abdomen and pelvis was performed using the standard protocol during bolus administration of intravenous contrast. Multiplanar reconstructed images and MIPs were obtained and reviewed to evaluate the vascular anatomy. CONTRAST:  OMNIPAQUE IOHEXOL 350 MG/ML SOLN COMPARISON:  None. FINDINGS: CTA CHEST FINDINGS Cardiovascular: No intramural hematoma. No aortic dissection or aneurysm on postcontrast imaging. Although limited by motion there could be a subsegmental pulmonary embolism in the right lower lobe where there is pulmonary opacity. No central embolic disease is seen, limited by motion and contrast timing this aortic scan. Mediastinum/Nodes: Negative for mass or adenopathy. Lungs/Pleura: Patchy airspace opacity somewhat peripheral. No cavitation is currently seen. Anterior basal segment right lower lobe wedge-shaped opacity correlating with the suspected embolism. Musculoskeletal: T8 hemangioma. No evidence of osteomyelitis or sternoclavicular arthritis. Review of the MIP images confirms the above findings. CTA ABDOMEN AND PELVIS FINDINGS VASCULAR Aorta: Smooth and widely patent.  No atheromatous changes Celiac: Is normal SMA: Somewhat small caliber vessels which may be from splanchnic vaso constriction. No branch  occlusion or beading where there is permissible opacification for visualization. Renals: Widely patent IMA: Patent Inflow: Small caliber but widely patent and without atheromatous changes or ulceration Veins: Unremarkable in the arterial phase Review of the MIP images confirms the above findings. NON-VASCULAR Hepatobiliary: Geographic liver perfusion which is nonspecific. No underlying vascular compromise is noted.No evidence of biliary obstruction or stone. Pancreas: Unremarkable. Spleen: Unremarkable. Adrenals/Urinary Tract: Negative adrenals. Striated nephrogram diffusely on both sides. No hydronephrosis or stone. No abscess. Unremarkable bladder. Stomach/Bowel: No obstruction. Appendectomy. No evidence of bowel inflammation or ischemia. Lymphatic: No mass or adenopathy. Reproductive:No pathologic findings. Other: No ascites or pneumoperitoneum. Musculoskeletal: Fat stranding surrounds the L5-S1 disc space and sacrum with hazy appearance of the canal and foramina the level of the sacrum. There is underlying L5-S1 disc degeneration with height loss, sclerosis, and ridging. Critical Value/emergent results were called by telephone at the time of interpretation on April 17, 2019 at 5:24 am to providerIVA Julann Mcgilvray , who verbally acknowledged these results. Review of the MIP images confirms the above findings. IMPRESSION: 1. Patchy pulmonary opacity, favor hematogenous pneumonia/septic emboli (although no cavitation). Suspect a subsegmental pulmonary embolism to the right lower lobe with subsegmental infarct or pneumonia, certainty limited by motion artifact. 2. Bilateral heterogeneous renal perfusion suggesting embolic disease in this case, presumably septic. 3. Small caliber visceral branches, likely shock mediated vasoconstruction. 4. Fat inflammation around the L5-S1 degenerated disc and around the upper sacrum compatible with osteomyelitis/discitis in this setting. MR could determine extent of disease once stabilized. 5.  Heterogeneous liver perfusion without underlying visible vascular compromise. 6. Normal aorta. Electronically Signed   By: Marnee Spring M.D.   On: 04-17-19 05:28    Procedures .Critical Care Performed by: Devoria Albe, MD Authorized by: Devoria Albe, MD   Critical care provider statement:    Critical care time (minutes):  46   Critical care was necessary to treat or prevent imminent or life-threatening deterioration of the following conditions:  Circulatory failure and sepsis   Critical care was time spent personally by me on the following activities:  Discussions with consultants, examination of patient, obtaining history from patient or surrogate, ordering and review of laboratory studies, ordering and review of radiographic studies, pulse oximetry and re-evaluation of patient's condition   (including critical care time)  Medications Ordered in ED Medications  heparin bolus via infusion 4,000 Units (4,000 Units Intravenous Bolus from Bag 04-17-19 0605)    Followed by  heparin  ADULT infusion 100 units/mL (25000 units/260mL sodium chloride 0.45%) (900 Units/hr Intravenous New Bag/Given March 31, 2019 0606)  phenylephrine (NEOSYNEPHRINE) 10-0.9 MG/250ML-% infusion (40 mcg/min Intravenous Rate/Dose Change 2019-03-31 0705)  0.9 %  sodium chloride infusion (has no administration in time range)  0.9 %  sodium chloride infusion (has no administration in time range)  iohexol (OMNIPAQUE) 350 MG/ML injection 100 mL (100 mLs Intravenous Contrast Given March 31, 2019 0458)  morphine 4 MG/ML injection 4 mg (4 mg Intravenous Given 2019/03/31 0506)  ondansetron (ZOFRAN) injection 4 mg (4 mg Intravenous Given 31-Mar-2019 0506)  sodium chloride 0.9 % bolus 1,000 mL (0 mLs Intravenous Stopped Mar 31, 2019 0635)  cefTRIAXone (ROCEPHIN) 2 g in sodium chloride 0.9 % 100 mL IVPB (0 g Intravenous Stopped 31-Mar-2019 0625)  vancomycin (VANCOCIN) IVPB 1000 mg/200 mL premix (0 mg Intravenous Stopped 03/31/19 0702)  sodium chloride 0.9 % bolus  1,000 mL (0 mLs Intravenous Stopped March 31, 2019 0714)  sodium chloride 0.9 % bolus 1,000 mL (1,000 mLs Intravenous New Bag/Given 03/31/19 0620)     Initial Impression / Assessment and Plan / ED Course  I have reviewed the triage vital signs and the nursing notes.  Pertinent labs & imaging results that were available during my care of the patient were reviewed by me and considered in my medical decision making (see chart for details).    I had ordered an i-STAT 8 before patient went to CT however it was not done until she returned from CT and I was not informed of that.  Her initial creatinine was 5.  She did get contrast dye.    Initial blood pressure was reported to be 76 manual.  She was given IV fluid bolus.  She was given IV morphine for pain.  5:20 AM blood pressure now is 130 range.  5:22 AM patient was discussed with the radiologist.  He states she is having pulmonary emboli and he suspects it may be septic emboli.  He states there is no obvious right heart strain.  He called me back a few minutes later and states he also saw evidence of discitis at the L5-S1 level.  Patient was started on IV antibiotics for endocarditis and also IV heparin.  She had 3 sets of blood cultures drawn prior to starting her antibiotics.  Patient denies any IV drug use, however she does admit to snorting and smoking methamphetamine however she states she stopped about a month ago.   Patient's blood pressure continues to ping-pong between being low in the 70s and then into the normal range.  She received 3 L of normal saline and initially had good response however she became hypotensive again and then was started on phenyl ephedrine drip.  She was started on Rocephin and vancomycin for possible endocarditis or septic emboli.  Patient denies IV drug abuse however her clinical picture is highly suspicious for it.  She does not have any obvious track marks.    6:14 AM patient was discussed with Dr. Levada Schilling,  intensivist.  Her blood pressure had been fine however during our conversation nurse reports her blood pressure dropped to 62.  He states to admit her to ICU under Dr. Marchelle Gearing as attending.  He is agreeable to starting the phenyl ephedrine if her blood pressure drops again after getting her 3 L IV fluids.  Dr Juleen China made aware of patient at change of shift.   Final Clinical Impressions(s) / ED Diagnoses   Final diagnoses:  Acute pulmonary embolism without acute cor pulmonale, unspecified pulmonary embolism type (  [place])  Acute renal failure, unspecified acute renal failure type ([place])  Hypotension, unspecified hypotension type  Pulmonary infarction [facility])  Septic embolism ([place])  Discitis of lumbosacral region  Methamphetamine abuse ([place])  Non-traumatic rhabdomyolysis    Plan admission  Rolland Porter, MD, Barbette Or, MD Apr 05, 2019 0730    Rolland Porter, MD 05-Apr-2019 [phone]

## 2019-04-04 NOTE — ED Notes (Signed)
Multiple unsessessful attempts for [facility] placement made at this time. Respiratory therapist attempting ABG at this time. PT continues to complain of pain all over and more to legs. PT noted to be cold to the touch and have mottling noted that extending up both legs and into mid-abdomen. Bed placement called again at this time and they stated they are moving a patient off the unit at [facility] and try to create her a bed.

## 2019-04-04 NOTE — Progress Notes (Signed)
ANTICOAGULATION CONSULT NOTE - Preliminary  Pharmacy Consult for heparin Indication: pulmonary embolus  No Known Allergies  Patient Measurements:  TBW=81.6 kg IBW= 61.6 kg Heparin dosing weight = 78 kg     Vital Signs: Temp: 97.8 F (36.6 C) (09/23 0520) BP: 118/94 (09/23 0551) Pulse Rate: 116 (09/23 0515)  Labs: Recent Labs    19-Apr-2019 0444 April 19, 2019 0516  HGB 13.4 13.9  HCT 42.2 41.0  PLT 162  --   CREATININE 4.82* 5.40*   Estimated Creatinine Clearance: 15.7 mL/min (A) (by C-G formula based on SCr of 5.4 mg/dL (H)).  Medical History: Past Medical History:  Diagnosis Date  . IV drug user     Medications:  Infusions:  . cefTRIAXone (ROCEPHIN)  IV 2 g (19-Apr-2019 0551)  . heparin    . vancomycin 1,000 mg (04/19/19 0550)    Assessment: 38 yo with pulmonary emboli and suspected septic emboli without heart strain.  Pharmacy has been asked to dose heparin.  Patient with renal failure, CrCl=15 mL/min.   Goal of Therapy:  Heparin level 0.3-0.7 units/ml   Plan:  Give 4000 units bolus x 1 Start heparin infusion at 900 units/hr Check anti-Xa level in 8 hours and daily while on heparin Continue to monitor H&H and platelets Preliminary review of pertinent patient information completed.  Forestine Na clinical pharmacist will complete review during morning rounds to assess the patient and finalize treatment regimen.  Mistie Adney Scarlett, RPH 04/19/2019,5:59 AM

## 2019-04-04 NOTE — ED Notes (Signed)
Date and time results received: 04-21-19 0617  Test: Lactic Critical Value: 5.3  Name of Provider Notified: Tomi Bamberger  Orders Received? Or Actions Taken?: n/a

## 2019-04-04 NOTE — ED Notes (Signed)
EDP at bedside and was preparing patient for intubation with non-rebreather mask on. EDP/nursing noticied rhythm change and no pulse noted at 1123 with CPR started. 1123- 1mg  of Epi given 1124- 1 amp Bicarb given Pulse check with PEA on monitor at 1126 1128- amiodarone 150 mg given Asystole noted at 1129 1129- Epi 1mg  given 1131- 1 amp of bicard given at this time and asystole noted at this time 1132- Epi 1mg  given Time of Death called at 1134

## 2019-04-04 NOTE — ED Triage Notes (Signed)
Pt c/o sob, back pain, severe extremity pain, and mottled skin.

## 2019-04-04 NOTE — ED Notes (Signed)
RT attempted x 3 to place ALine. All 3 attempts failed. RT was able to obtain an ABG. Results pending.

## 2019-04-04 NOTE — ED Notes (Signed)
Report given to [facility] with [facility] at this time.

## 2019-04-04 NOTE — Progress Notes (Signed)
Present with Yvonne Hill for emotional and spiritual support.

## 2019-04-04 NOTE — ED Provider Notes (Addendum)
I assumed care as coming ED provider. Pt admitted to ICU at [facility] and awaiting transport. Diffuse septic emboli in setting of suspected IVDU. Septic shock. Neutropenic. BP remains labile despite multiple liters of crystalloid and phenylephrine. Abx previously ordered. Central line placed. Levophed started. Additional IVF. Pt remains in obvious discomfort but alert and following commands.   11:38 AM Pt becoming increasingly agitated/confused. She was given 1mg  of ativan but I suspect emboli to brain. She suddenly became dysarthric. Not moving L arm. Made decision to intubate. While getting set up, she became completely unresponsive. Lost pulses. PEA. CPR initiated. See Code sheet for specifics but received epi and bicarb. Intubated w/o RSI meds. CPR continued for over 10 minutes and remained in PEA. Given underlying issues and amount of support she has received up to this point, I did not feel that prolonged CPR efforts would result in any meaningful recovery. I spoke with her father in the ER. Will notify ME. Time of death 10/30/1132.   11:56 AM Discussed with 1135, ME.   CENTRAL LINE Performed by: Dyanne Carrel Consent: The procedure was performed in an emergent situation. Required items: required blood products, implants, devices, and special equipment available Patient identity confirmed: arm band and provided demographic data Time out: Immediately prior to procedure a "time out" was called to verify the correct patient, procedure, equipment, support staff and site/side marked as required. Indications: vascular access Anesthesia: local infiltration Local anesthetic: lidocaine 1% with epinephrine Anesthetic total: 3 ml Patient sedated: no Preparation: skin prepped with 2% chlorhexidine Skin prep agent dried: skin prep agent completely dried prior to procedure Sterile barriers: all five maximum sterile barriers used - cap, mask, sterile gown, sterile gloves, and large sterile sheet Hand  hygiene: hand hygiene performed prior to central venous catheter insertion  Location details: R IJ  Catheter type: triple lumen Catheter size: 8 Fr Pre-procedure: landmarks identified Ultrasound guidance: yes Successful placement: yes, ~15 cm Post-procedure: line sutured and dressing applied Assessment: blood return through all parts, free fluid flow, placement verified by x-ray and no pneumothorax on x-ray Patient tolerance: Patient tolerated the procedure well with no immediate complications.  CRITICAL CARE Performed by: Raeford Razor Total critical care time: 135 minutes Critical care time was exclusive of separately billable procedures and treating other patients. Critical care was necessary to treat or prevent imminent or life-threatening deterioration. Critical care was time spent personally by me on the following activities: development of treatment plan with patient and/or surrogate as well as nursing, discussions with consultants, evaluation of patient's response to treatment, examination of patient, obtaining history from patient or surrogate, ordering and performing treatments and interventions, ordering and review of laboratory studies, ordering and review of radiographic studies, pulse oximetry and re-evaluation of patient's condition.  INTUBATION Performed by: Raeford Razor  Required items: required blood products, implants, devices, and special equipment available Patient identity confirmed: provided demographic data and hospital-assigned identification number Time out: Immediately prior to procedure a "time out" was called to verify the correct patient, procedure, equipment, support staff and site/side marked as required.  Indications: airway protection, respiratory failure  Intubation method: Glidescope Laryngoscopy   Preoxygenation: BVM  Sedatives: none Paralytic: none  Tube Size: 7.5 cuffed  Post-procedure assessment: chest rise and ETCO2 monitor Breath  sounds: equal and absent over the epigastrium Tube secured with: ETT holder Chest x-ray not performed because pt did not survive to obtain imaging.   Patient tolerated the procedure well with no immediate complications.  Cardiopulmonary Resuscitation (CPR)  Procedure Note Directed/Performed by: Virgel Manifold I personally directed ancillary staff and/or performed CPR in an effort to regain return of spontaneous circulation and to maintain cardiac, neuro and systemic perfusion.        Virgel Manifold, MD 03-29-2019 [id number]    Virgel Manifold, MD 2019/03/29 [phone]

## 2019-04-04 NOTE — ED Notes (Signed)
EDP at bedside at this time placing central line.

## 2019-04-04 NOTE — ED Notes (Signed)
PT noted to start slurring her words at this time and loosing function to the left side of her body. EDP made aware and preparing to intubate patient due to decreased respirations at this time.

## 2019-04-04 DEATH — deceased

## 2019-04-14 LAB — URINE CULTURE
Culture: >=100000 — AB
Special Requests: NORMAL

## 2021-05-08 IMAGING — CT CT L SPINE W/O CM
3 series · 13 of 33 positions shown, 16 images · non-contrast
Comparison: None.

CLINICAL DATA: Worsening low back pain for 1 month. Left
leg/posterior thigh numbness. Painful straight leg raise.

EXAM:
CT LUMBAR SPINE WITHOUT CONTRAST
TECHNIQUE: Multidetector CT imaging of the lumbar spine was performed without
intravenous contrast administration. Multiplanar CT image
reconstructions were also generated.

[Series 4: l spine soft · axial · 0.34mm/px · z∈[-251,-109]mm · 5 of 103 slices shown, 7 images]
[im 16/103  soft-tissue]
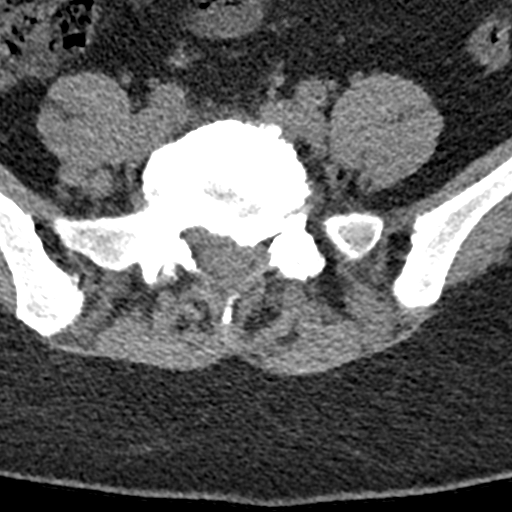
[im 16/103  bone]
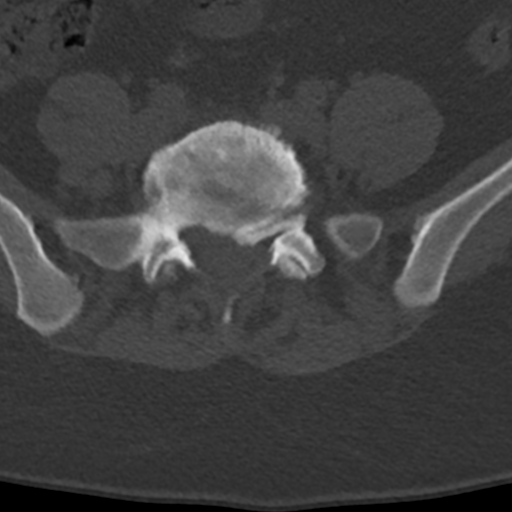
[im 32/103  bone]
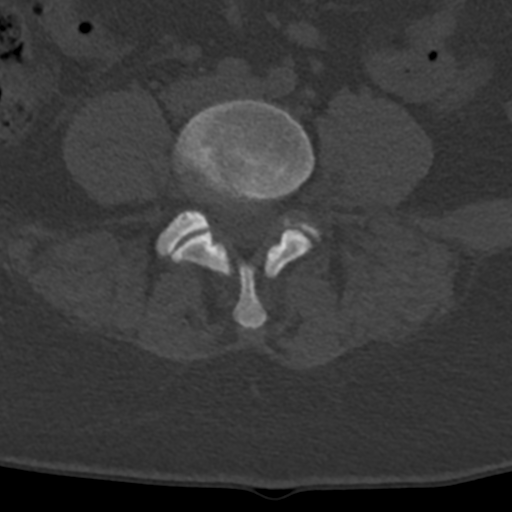
[im 55/103  bone]
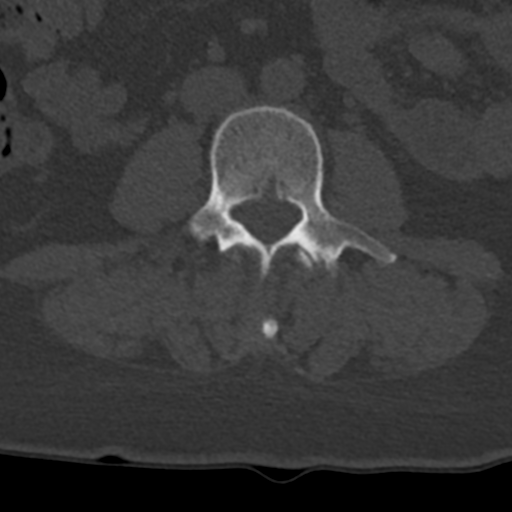
[im 71/103  bone]
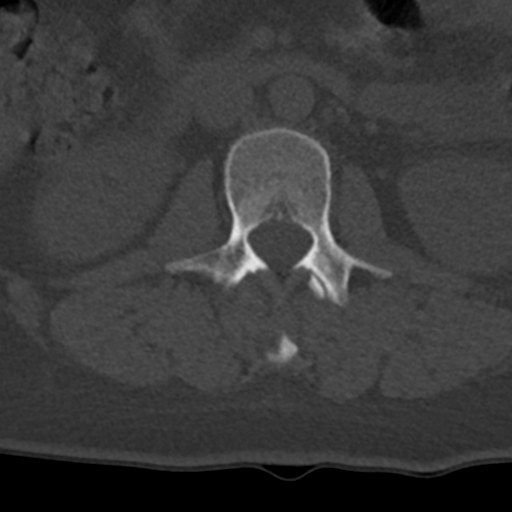
[im 87/103  soft-tissue]
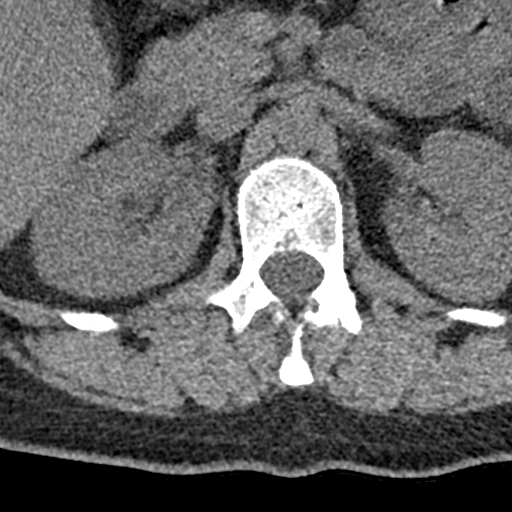
[im 87/103  bone]
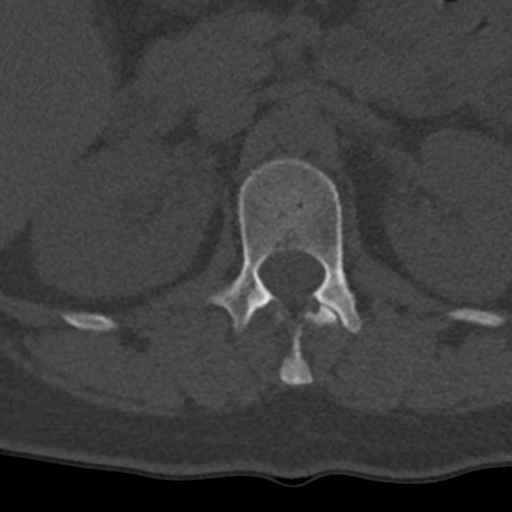

[Series 5: sagittal bone · sagittal · 0.30mm/px · 5 of 74 slices shown, 6 images]
[im 25/74  bone]
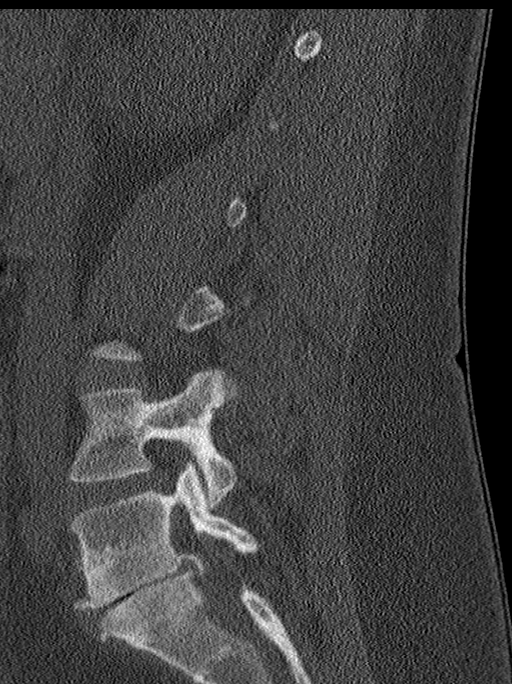
[im 31/74  bone]
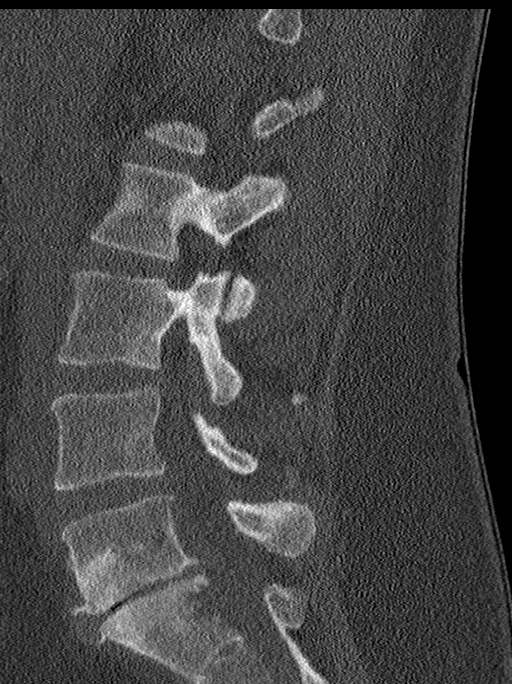
[im 37/74  soft-tissue]
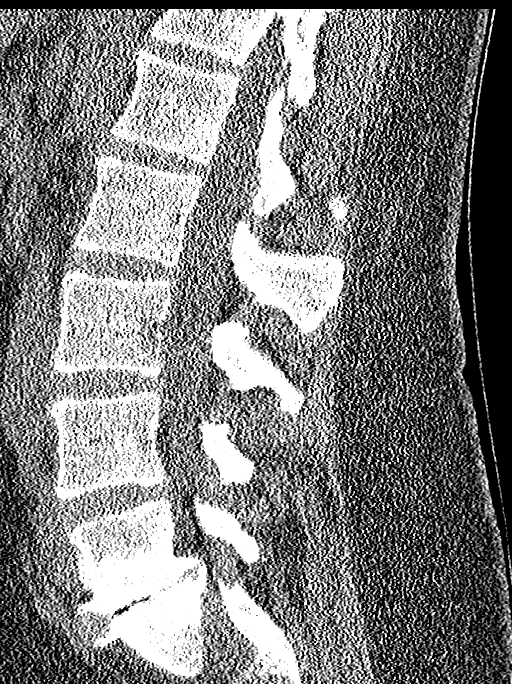
[im 37/74  bone]
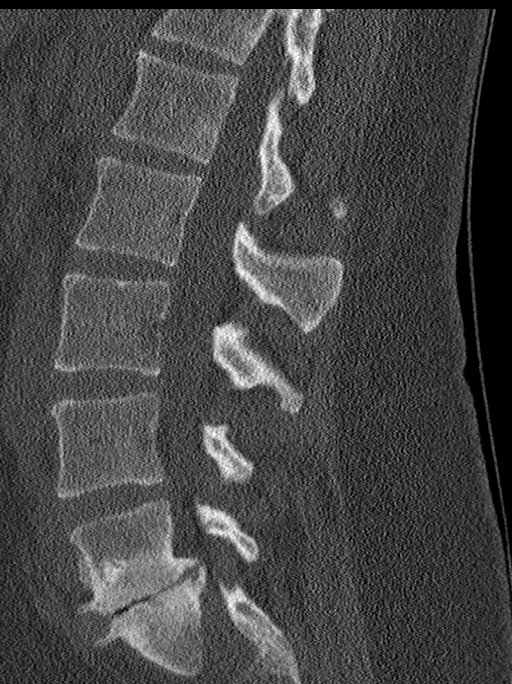
[im 43/74  bone]
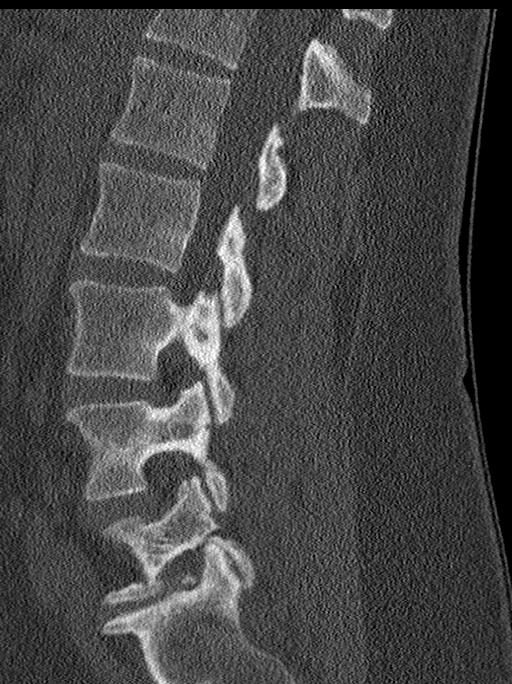
[im 49/74  bone]
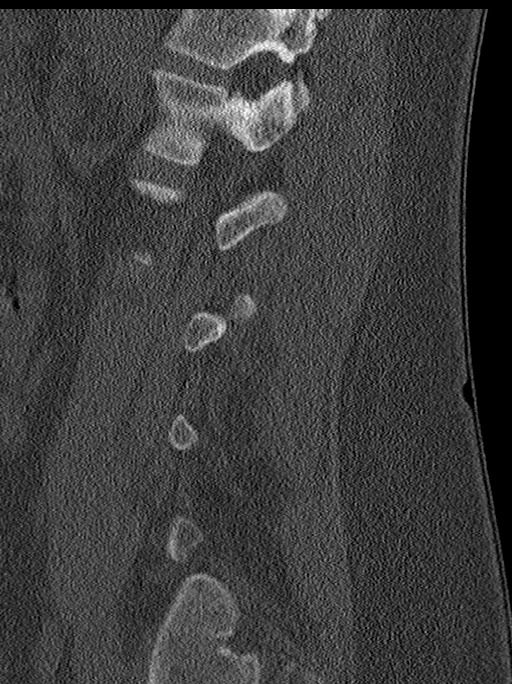

[Series 6: coronal bone · coronal · 0.30mm/px · 3 of 69 slices shown]
[im 14/69  bone]
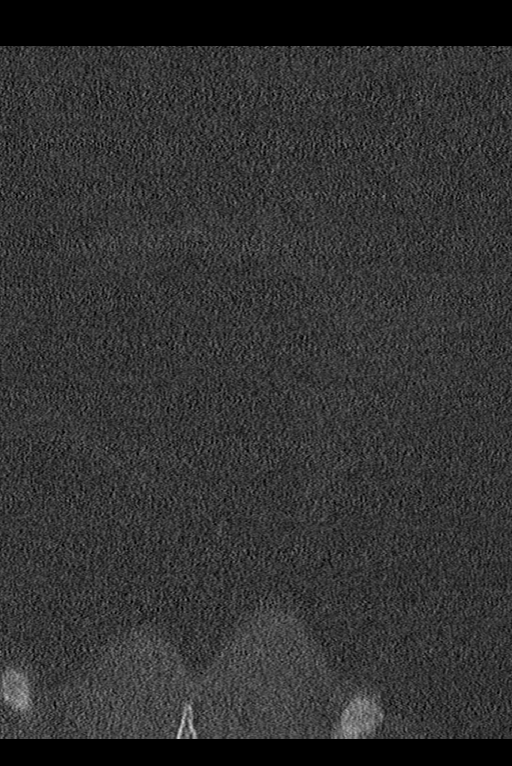
[im 28/69  bone]
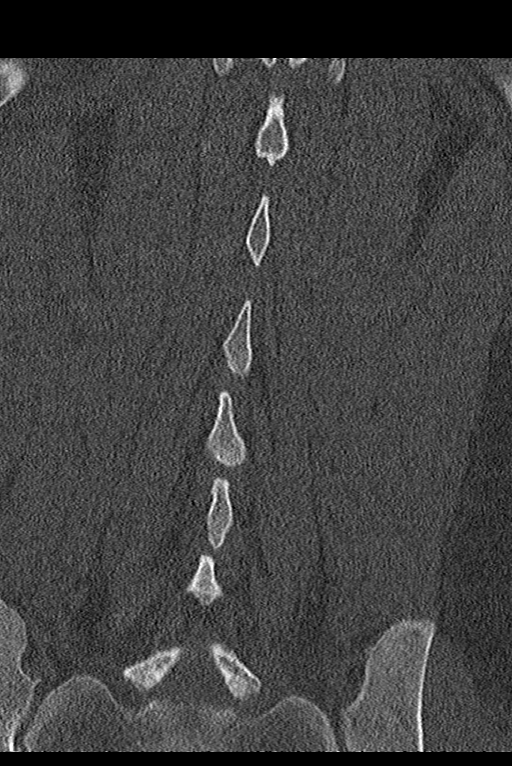
[im 41/69  bone]
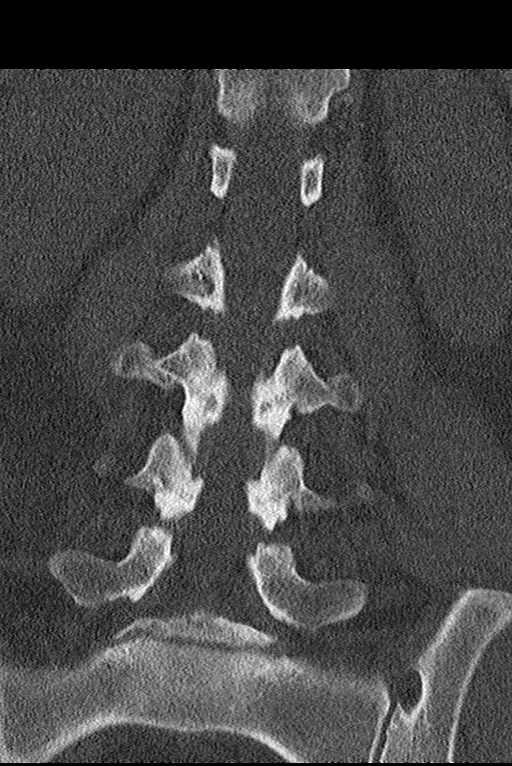

[13 of 33 positions shown; findings below may reference images not displayed]

FINDINGS: Segmentation: 5 lumbar type vertebrae.

Alignment: Normal.

Vertebrae: No fracture or suspicious osseous lesion.

Paraspinal and other soft tissues: Unremarkable.

Disc levels:

T12-L1 through L3-4: Negative.

L4-5: Mild disc bulging without evidence of significant stenosis.

L5-S1: Advanced disc degeneration with severe disc space narrowing,
vacuum disc, and prominent degenerative endplate sclerosis and
spurring. A circumferential disc osteophyte complex and disc space
height loss result in spinal and bilateral lateral recess stenosis
potentially affecting the S1 nerve roots as well as moderate
bilateral neural foraminal stenosis.
IMPRESSION: Advanced L5-S1 disc degeneration with bilateral lateral recess and
neural foraminal stenosis. No acute osseous abnormality.

## 2021-05-11 IMAGING — CT CT ANGIO CHEST-ABD-PELV FOR DISSECTION W/ AND WO/W CM
2 of 7 series · 14 of 46 positions shown, 16 images · IV contrast (omnipaque)
Comparison: None.

CLINICAL DATA: Back pain.  Mottling of extremities

EXAM:
CT ANGIOGRAPHY CHEST, ABDOMEN AND PELVIS
TECHNIQUE: Multidetector CT imaging through the chest, abdomen and pelvis was
performed using the standard protocol during bolus administration of
intravenous contrast. Multiplanar reconstructed images and MIPs were
obtained and reviewed to evaluate the vascular anatomy.
CONTRAST:  100mL OMNIPAQUE IOHEXOL 350 MG/ML SOLN

[Series 5: axial arterial · axial · arterial · 0.69mm/px · z∈[+842,+1388]mm · 11 of 208 slices shown, 13 images]
[im 13/208  soft-tissue]
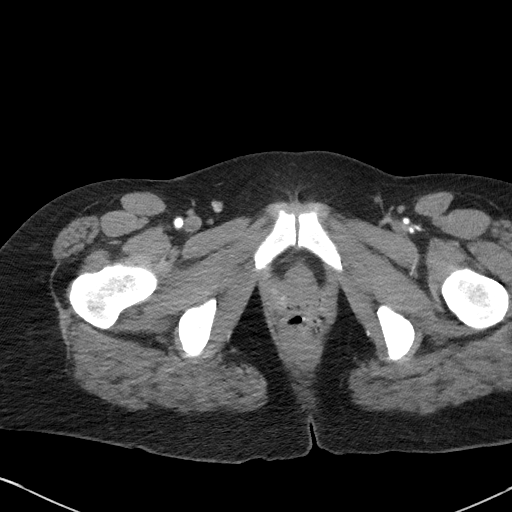
[im 13/208  bone]
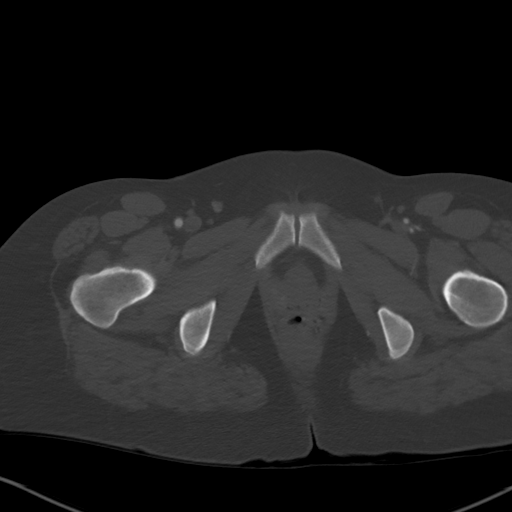
[im 37/208  soft-tissue]
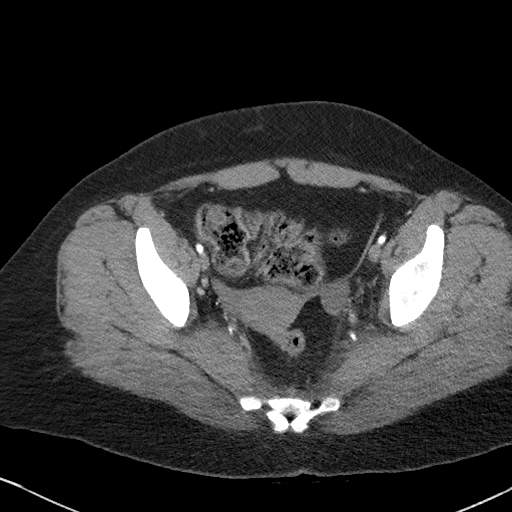
[im 49/208  soft-tissue]
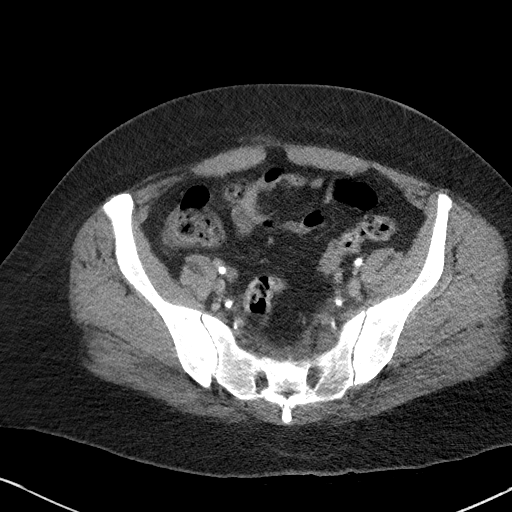
[im 74/208  soft-tissue]
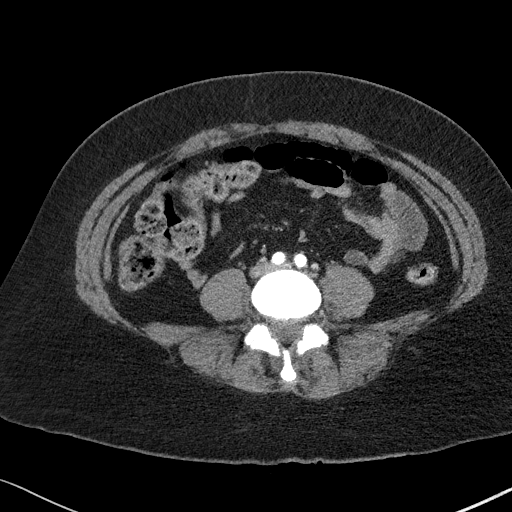
[im 86/208  soft-tissue]
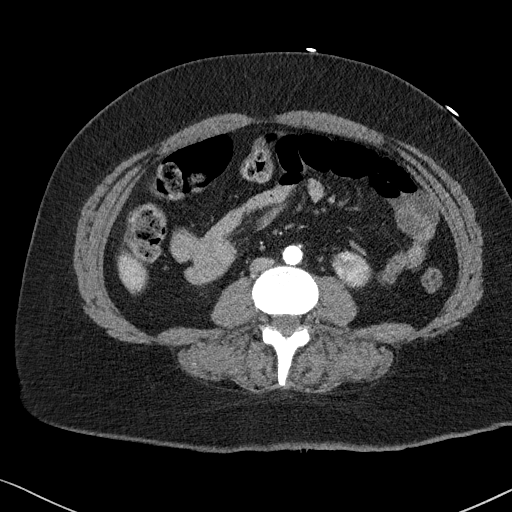
[im 110/208  soft-tissue]
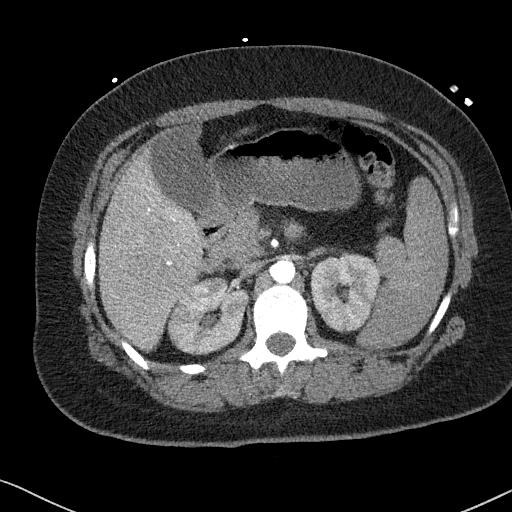
[im 122/208  soft-tissue]
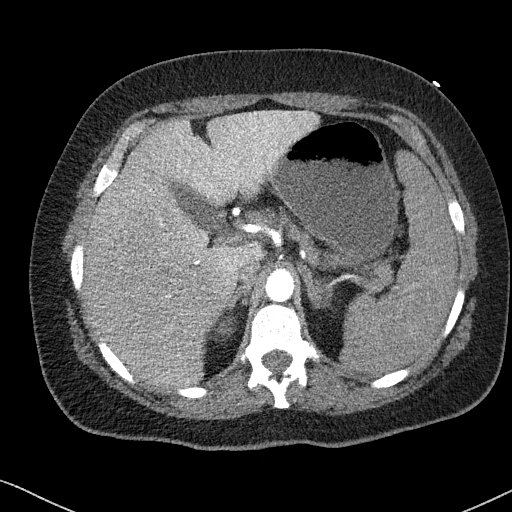
[im 134/208  soft-tissue]
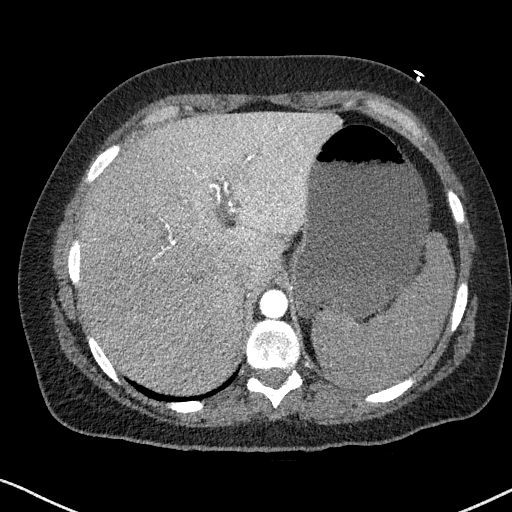
[im 159/208  soft-tissue]
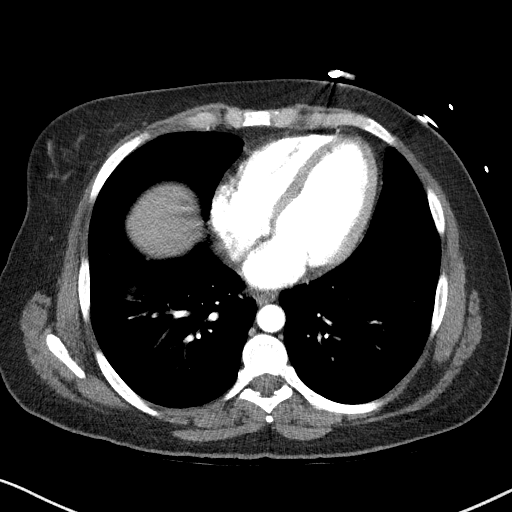
[im 159/208  bone]
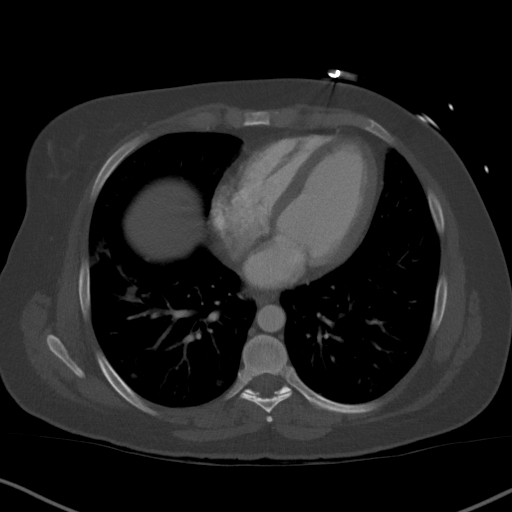
[im 171/208  soft-tissue]
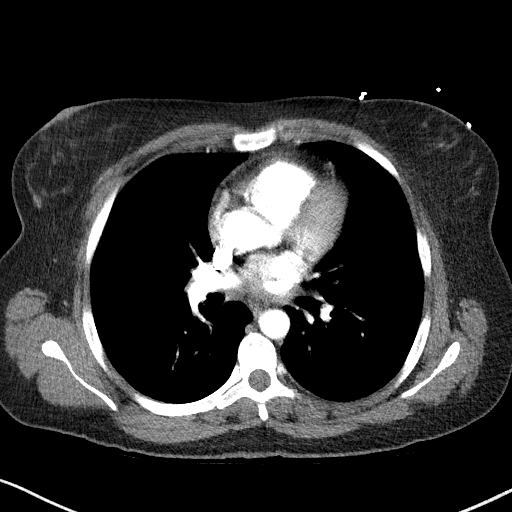
[im 195/208  soft-tissue]
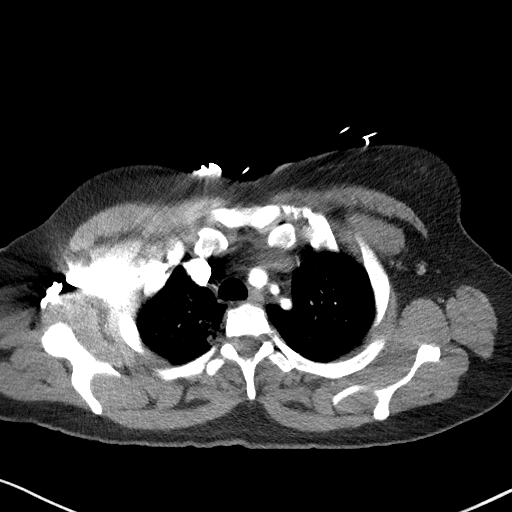

[Series 9: cor soft · coronal · 0.82mm/px · 3 of 135 slices shown]
[im 34/135  soft-tissue]
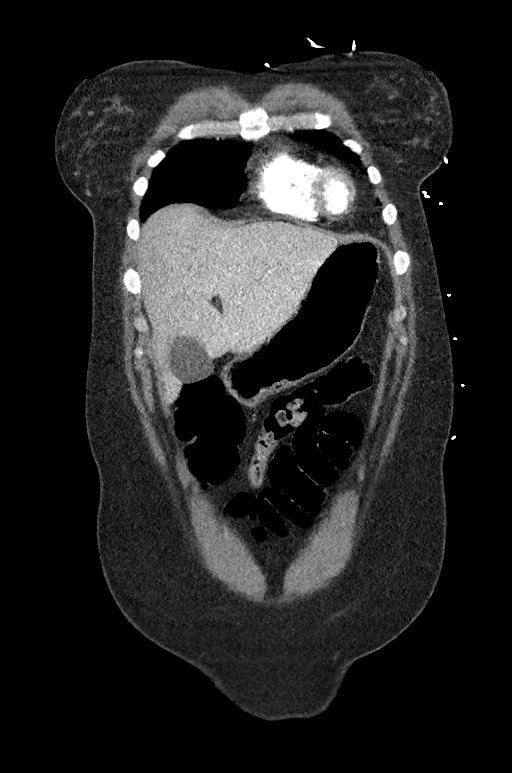
[im 68/135  soft-tissue]
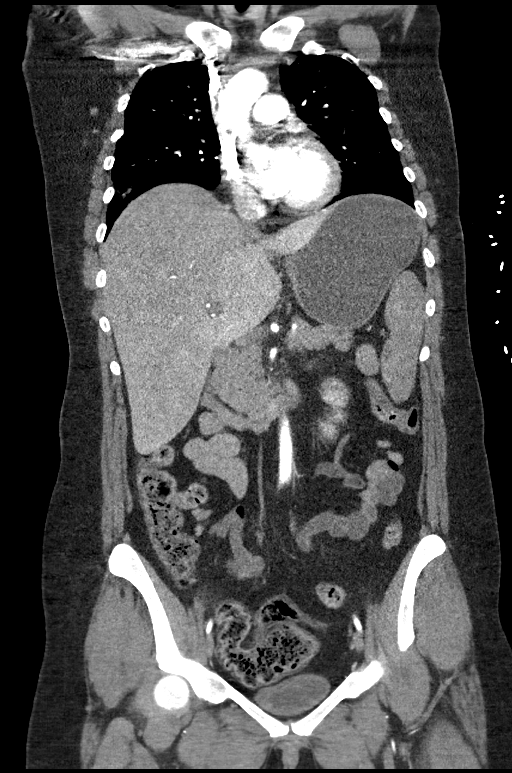
[im 101/135  soft-tissue]
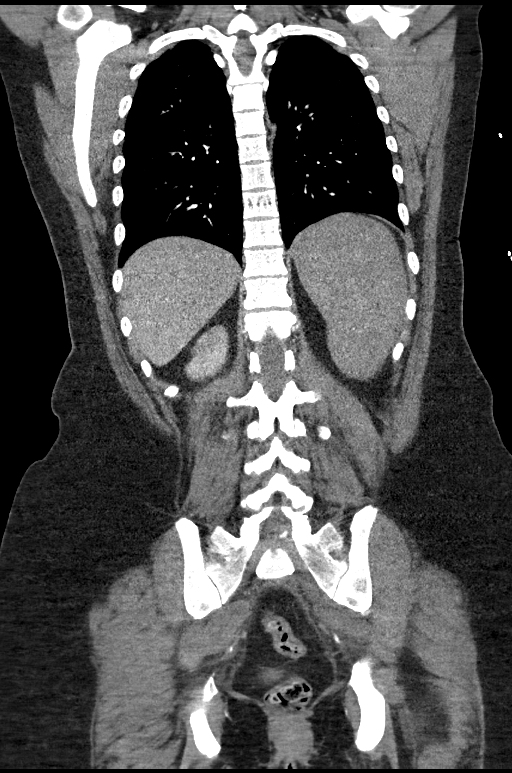

[14 of 46 positions shown; findings below may reference images not displayed]

FINDINGS: CTA CHEST FINDINGS

Cardiovascular: No intramural hematoma. No aortic dissection or
aneurysm on postcontrast imaging. Although limited by motion there
could be a subsegmental pulmonary embolism in the right lower lobe
where there is pulmonary opacity. No central embolic disease is
seen, limited by motion and contrast timing this aortic scan.

Mediastinum/Nodes: Negative for mass or adenopathy.

Lungs/Pleura: Patchy airspace opacity somewhat peripheral. No
cavitation is currently seen. Anterior basal segment right lower
lobe wedge-shaped opacity correlating with the suspected embolism.

Musculoskeletal: T8 hemangioma. No evidence of osteomyelitis or
sternoclavicular arthritis.

Review of the MIP images confirms the above findings.

CTA ABDOMEN AND PELVIS FINDINGS

VASCULAR

Aorta: Smooth and widely patent.  No atheromatous changes

Celiac: Is normal

SMA: Somewhat small caliber vessels which may be from splanchnic
[place] constriction. No branch occlusion or beading where there is
permissible opacification for visualization.

Renals: Widely patent

IMA: Patent

Inflow: Small caliber but widely patent and without atheromatous
changes or ulceration

Veins: Unremarkable in the arterial phase

Review of the MIP images confirms the above findings.

NON-VASCULAR

Hepatobiliary: Geographic liver perfusion which is nonspecific. No
underlying vascular compromise is noted.No evidence of biliary
obstruction or stone.

Pancreas: Unremarkable.

Spleen: Unremarkable.

Adrenals/Urinary Tract: Negative adrenals. Striated nephrogram
diffusely on both sides. No hydronephrosis or stone. No abscess.
Unremarkable bladder.

Stomach/Bowel: No obstruction. Appendectomy. No evidence of bowel
inflammation or ischemia.

Lymphatic: No mass or adenopathy.

Reproductive:No pathologic findings.

Other: No ascites or pneumoperitoneum.

Musculoskeletal: Fat stranding surrounds the L5-S1 disc space and
sacrum with hazy appearance of the canal and foramina the level of
the sacrum. There is underlying L5-S1 disc degeneration with height
loss, sclerosis, and ridging.

Critical Value/emergent results were called by telephone at the time
of interpretation on 03/27/2019 at [DATE] to Belinchii Sobacinschi , who
verbally acknowledged these results.

Review of the MIP images confirms the above findings.
IMPRESSION: 1. Patchy pulmonary opacity, favor hematogenous pneumonia/septic
emboli (although no cavitation). Suspect a subsegmental pulmonary
embolism to the right lower lobe with subsegmental infarct or
pneumonia, certainty limited by motion artifact.
2. Bilateral heterogeneous renal perfusion suggesting embolic
disease in this case, presumably septic.
3. Small caliber visceral branches, likely shock mediated
vasoconstruction.
4. Fat inflammation around the L5-S1 degenerated disc and around the
upper sacrum compatible with osteomyelitis/discitis in this setting.
MR could determine extent of disease once stabilized.
5. Heterogeneous liver perfusion without underlying visible vascular
compromise.
6. Normal aorta.
